# Patient Record
Sex: Female | Born: 2000
Health system: Southern US, Community
[De-identification: ages and names within clinical notes are randomized; demographics above are authoritative.]

## PROBLEM LIST (undated history)

## (undated) DIAGNOSIS — J45909 Unspecified asthma, uncomplicated: Secondary | ICD-10-CM

---
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
## 2018-10-15 ENCOUNTER — Encounter (HOSPITAL_COMMUNITY): Payer: Self-pay | Admitting: *Deleted

## 2018-10-15 ENCOUNTER — Emergency Department (HOSPITAL_COMMUNITY)
Admission: EM | Admit: 2018-10-15 | Discharge: 2018-10-15 | Disposition: A | Discharge status: Home / Self Care | Payer: Self-pay | Attending: Emergency Medicine | Admitting: Emergency Medicine

## 2018-10-15 ENCOUNTER — Other Ambulatory Visit: Payer: Self-pay

## 2018-10-15 DIAGNOSIS — J45909 Unspecified asthma, uncomplicated: Secondary | ICD-10-CM | POA: Insufficient documentation

## 2018-10-15 DIAGNOSIS — N939 Abnormal uterine and vaginal bleeding, unspecified: Secondary | ICD-10-CM | POA: Insufficient documentation

## 2018-10-15 HISTORY — DX: Unspecified asthma, uncomplicated: J45.909

## 2018-10-15 LAB — CBC WITH DIFFERENTIAL/PLATELET
Abs Immature Granulocytes: 0.01 10*3/uL (ref 0.00–0.07)
Basophils Absolute: 0 10*3/uL (ref 0.0–0.1)
Basophils Relative: 1 %
Eosinophils Absolute: 0.6 10*3/uL — ABNORMAL HIGH (ref 0.0–0.5)
Eosinophils Relative: 10 %
HCT: 39.5 % (ref 36.0–46.0)
Hemoglobin: 12.8 g/dL (ref 12.0–15.0)
Immature Granulocytes: 0 %
Lymphocytes Relative: 31 %
Lymphs Abs: 1.9 10*3/uL (ref 0.7–4.0)
MCH: 29.4 pg (ref 26.0–34.0)
MCHC: 32.4 g/dL (ref 30.0–36.0)
MCV: 90.8 fL (ref 80.0–100.0)
Monocytes Absolute: 0.4 10*3/uL (ref 0.1–1.0)
Monocytes Relative: 6 %
Neutro Abs: 3.1 10*3/uL (ref 1.7–7.7)
Neutrophils Relative %: 52 %
Platelets: 256 10*3/uL (ref 150–400)
RBC: 4.35 MIL/uL (ref 3.87–5.11)
RDW: 12 % (ref 11.5–15.5)
WBC: 6 10*3/uL (ref 4.0–10.5)
nRBC: 0 % (ref 0.0–0.2)

## 2018-10-15 LAB — COMPREHENSIVE METABOLIC PANEL
ALT: 10 U/L (ref 0–44)
AST: 16 U/L (ref 15–41)
Albumin: 5 g/dL (ref 3.5–5.0)
Alkaline Phosphatase: 53 U/L (ref 38–126)
Anion gap: 11 (ref 5–15)
BUN: 16 mg/dL (ref 6–20)
CO2: 24 mmol/L (ref 22–32)
Calcium: 9.5 mg/dL (ref 8.9–10.3)
Chloride: 106 mmol/L (ref 98–111)
Creatinine, Ser: 0.65 mg/dL (ref 0.44–1.00)
GFR calc Af Amer: >60 mL/min (ref >60)
GFR calc non Af Amer: >60 mL/min (ref >60)
Glucose, Bld: 90 mg/dL (ref 70–99)
Potassium: 3.8 mmol/L (ref 3.5–5.1)
Sodium: 141 mmol/L (ref 135–145)
Total Bilirubin: 0.7 mg/dL (ref 0.3–1.2)
Total Protein: 8.2 g/dL — ABNORMAL HIGH (ref 6.5–8.1)

## 2018-10-15 LAB — I-STAT BETA HCG BLOOD, ED (MC, WL, AP ONLY): I-stat hCG, quantitative: <5 m[IU]/mL (ref <5)

## 2018-10-15 LAB — WET PREP, GENITAL
Clue Cells Wet Prep HPF POC: NONE SEEN
Sperm: NONE SEEN
Trich, Wet Prep: NONE SEEN
Yeast Wet Prep HPF POC: NONE SEEN

## 2018-10-15 LAB — LIPASE, BLOOD: Lipase: 45 U/L (ref 11–51)

## 2018-10-15 MED ORDER — NAPROXEN 500 MG PO TABS: 500.0000 mg | ORAL_TABLET | Freq: Two times a day (BID) | ORAL | 0 refills | Status: AC

## 2018-10-15 NOTE — ED Provider Notes (Signed)
[facility]-EMERGENCY DEPT Provider Note   CSN: [id number] Arrival date & time: 10/15/18  1335    History   Chief Complaint Chief Complaint  Patient presents with  . Vaginal Bleeding    HPI Gustavo Lahnna Lingelbach is a 18 y.o. female.     The history is provided by the patient.  Vaginal Bleeding Quality:  Lighter than menses Severity:  Mild Onset quality:  Gradual Timing:  Constant Progression:  Improving Chronicity:  Recurrent Menstrual history:  Irregular Context: spontaneously   Relieved by:  Nothing Worsened by:  Nothing Associated symptoms: no abdominal pain, no back pain, no dysuria and no fever   Risk factors: no STD     Past Medical History:  Diagnosis Date  . Asthma     There are no active problems to display for this patient.   History reviewed. No pertinent surgical history.   OB History   No obstetric history on file.      Home Medications    Prior to Admission medications   Medication Sig Start Date End Date Taking? Authorizing Provider  naproxen (NAPROSYN) 500 MG tablet Take 1 tablet (500 mg total) by mouth 2 (two) times daily with a meal. 10/15/18 11/14/18  Virgina Norfolkuratolo, Latash Nouri, DO    Family History No family history on file.  Social History Social History   Tobacco Use  . Smoking status: Never Smoker  . Smokeless tobacco: Never Used  Substance Use Topics  . Alcohol use: Never    Frequency: Never  . Drug use: Never     Allergies   Patient has no known allergies.   Review of Systems Review of Systems  Constitutional: Negative for chills and fever.  HENT: Negative for ear pain and sore throat.   Eyes: Negative for pain and visual disturbance.  Respiratory: Negative for cough and shortness of breath.   Cardiovascular: Negative for chest pain and palpitations.  Gastrointestinal: Negative for abdominal pain and vomiting.  Genitourinary: Positive for vaginal bleeding. Negative for dysuria and hematuria.  Musculoskeletal:  Negative for arthralgias and back pain.  Skin: Negative for color change and rash.  Neurological: Negative for seizures and syncope.  All other systems reviewed and are negative.    Physical Exam Updated Vital Signs  ED Triage Vitals  Enc Vitals Group     BP 10/15/18 1345 104/71     Pulse Rate 10/15/18 1345 (!) 110     Resp 10/15/18 1345 16     Temp 10/15/18 1345 98.9 F (37.2 C)     Temp Source 10/15/18 1345 Oral     SpO2 10/15/18 1345 98 %     Weight --      Height --      Head Circumference --      Peak Flow --      Pain Score 10/15/18 1347 0     Pain Loc --      Pain Edu? --      Excl. in GC? --     Physical Exam Vitals signs and nursing note reviewed.  Constitutional:      General: She is not in acute distress.    Appearance: She is well-developed.  HENT:     Head: Normocephalic and atraumatic.     Nose: Nose normal.     Mouth/Throat:     Mouth: Mucous membranes are moist.  Eyes:     Extraocular Movements: Extraocular movements intact.     Conjunctiva/sclera: Conjunctivae normal.     Pupils:  Pupils are equal, round, and reactive to light.  Neck:     Musculoskeletal: Neck supple.  Cardiovascular:     Rate and Rhythm: Normal rate and regular rhythm.     Heart sounds: No murmur.  Pulmonary:     Effort: Pulmonary effort is normal. No respiratory distress.     Breath sounds: Normal breath sounds.  Abdominal:     General: There is no distension.     Palpations: Abdomen is soft.     Tenderness: There is no abdominal tenderness.  Genitourinary:    Vagina: No vaginal discharge.     Comments: Scant blood in vaginal vault, no cervical motion tenderness, no adnexal tenderness Skin:    General: Skin is warm and dry.  Neurological:     Mental Status: She is alert.      ED Treatments / Results  Labs (all labs ordered are listed, but only abnormal results are displayed) Labs Reviewed  WET PREP, GENITAL - Abnormal; Notable for the following components:       Result Value   WBC, Wet Prep HPF POC MODERATE (*)    All other components within normal limits  CBC WITH DIFFERENTIAL/PLATELET - Abnormal; Notable for the following components:   Eosinophils Absolute 0.6 (*)    All other components within normal limits  COMPREHENSIVE METABOLIC PANEL - Abnormal; Notable for the following components:   Total Protein 8.2 (*)    All other components within normal limits  LIPASE, BLOOD  I-STAT BETA HCG BLOOD, ED ([facility], [facility], [facility] ONLY)  GC/CHLAMYDIA PROBE AMP ([place]) NOT AT [facility]    EKG None  Radiology No results found.  Procedures Procedures (including critical care time)  Medications Ordered in ED Medications - No data to display   Initial Impression / Assessment and Plan / ED Course  I have reviewed the triage vital signs and the nursing notes.  Pertinent labs & imaging results that were available during my care of the patient were reviewed by me and considered in my medical decision making (see chart for details).     Clarice Zulauf is a 18 year old female who presents to the ED with vaginal bleeding.  Patient normal vitals.  No fever.  Pregnancy test negative.  Scant blood in the vaginal vault.  No tenderness of cervix on exam.  No adnexal tenderness.  Patient with normal hemoglobin.  Wet prep unremarkable.  Patient not concerned about STDs.  Overall no concern for ectopic pregnancy or acute bleeding.  Patient not symptomatic from an anemia standpoint.  Gallbladder and liver enzymes within normal limits.  Given naproxen and given information to follow-up with OB/GYN.  Patient would benefit likely from being on birth control.  Discharged in ED in good condition.  Given return precautions.  This chart was dictated using voice recognition software.  Despite best efforts to proofread,  errors can occur which can change the documentation meaning.    Final Clinical Impressions(s) / ED Diagnoses   Final diagnoses:  Abnormal uterine bleeding (AUB)     ED Discharge Orders         Ordered    naproxen (NAPROSYN) 500 MG tablet  2 times daily with meals     10/15/18 1746           Janitza Revuelta, [place], DO 10/15/18 1747

## 2018-10-15 NOTE — ED Triage Notes (Signed)
States she started her period the end of last month, slowed down a little then started back heavy. 4-5 super tampons a day. Lower abd and back pain

## 2018-10-15 NOTE — ED Notes (Signed)
ED Provider at bedside. 

## 2018-10-18 LAB — GC/CHLAMYDIA PROBE AMP (~~LOC~~) NOT AT ARMC
Chlamydia: NEGATIVE
Neisseria Gonorrhea: NEGATIVE

## 2018-10-24 ENCOUNTER — Ambulatory Visit (INDEPENDENT_AMBULATORY_CARE_PROVIDER_SITE_OTHER): Payer: Medicaid Other | Admitting: Obstetrics & Gynecology

## 2018-10-24 ENCOUNTER — Encounter: Payer: Self-pay | Admitting: Obstetrics & Gynecology

## 2018-10-24 ENCOUNTER — Ambulatory Visit (HOSPITAL_BASED_OUTPATIENT_CLINIC_OR_DEPARTMENT_OTHER)
Admission: RE | Admit: 2018-10-24 | Discharge: 2018-10-24 | Disposition: A | Discharge status: Home / Self Care | Payer: Self-pay | Source: Ambulatory Visit | Attending: Obstetrics & Gynecology | Admitting: Obstetrics & Gynecology

## 2018-10-24 ENCOUNTER — Other Ambulatory Visit (HOSPITAL_COMMUNITY)
Admission: RE | Admit: 2018-10-24 | Discharge: 2018-10-24 | Disposition: A | Discharge status: Home / Self Care | Payer: Medicaid Other | Source: Ambulatory Visit | Attending: Obstetrics & Gynecology | Admitting: Obstetrics & Gynecology

## 2018-10-24 ENCOUNTER — Other Ambulatory Visit: Payer: Self-pay

## 2018-10-24 VITALS — BP 97/57 | HR 80 | Ht 60.0 in | Wt 85.0 lb

## 2018-10-24 DIAGNOSIS — Z3009 Encounter for other general counseling and advice on contraception: Secondary | ICD-10-CM | POA: Diagnosis not present

## 2018-10-24 DIAGNOSIS — N939 Abnormal uterine and vaginal bleeding, unspecified: Secondary | ICD-10-CM | POA: Diagnosis present

## 2018-10-24 DIAGNOSIS — R102 Pelvic and perineal pain: Secondary | ICD-10-CM | POA: Insufficient documentation

## 2018-10-24 DIAGNOSIS — Z113 Encounter for screening for infections with a predominantly sexual mode of transmission: Secondary | ICD-10-CM | POA: Diagnosis not present

## 2018-10-24 MED ORDER — CEFTRIAXONE SODIUM 250 MG IJ SOLR
250.0000 mg | Freq: Once | INTRAMUSCULAR | Status: AC
  Administered 2018-10-24: 250 mg via INTRAMUSCULAR

## 2018-10-24 MED ORDER — AZITHROMYCIN 500 MG PO TABS: 1000.0000 mg | ORAL_TABLET | Freq: Once | ORAL | 1 refills | Status: AC

## 2018-10-24 MED FILL — AZITHROMYCIN 500 MG TABLET: 500 | 1 days supply | Qty: 2 | Fill #0

## 2018-10-24 NOTE — Addendum Note (Signed)
Addended by: Phill Myron on: 10/24/2018 12:39 PM   Modules accepted: Orders

## 2018-10-24 NOTE — Progress Notes (Signed)
Subjective:     Gina Hall is a 18 y.o. female here for eval of pain and irreg bleeding. LMP late June Pt reports normal menses at that time. At the end of her cycle, the bleeding became heavier and has continued. She has been taking Naproxen with some relief ov the bleeding but, it has been intermittent for 22 days. Pt denies new sexual partners.   She is sexually active currently with no contraception. She was on Depo Provera for 2 injections.  Her last injection was Feb. She stopped due to irreg menses.  She reports pain with this cycle that is worse with activity. She denies abnormal discharge or odor.      Gynecologic History No LMP recorded. Contraception: none Last Pap: n/a Last mammogram: n/a  Obstetric History OB History  Gravida Para Term Preterm AB Living  1 1   1   1   SAB TAB Ectopic Multiple Live Births          1    # Outcome Date GA Lbr Len/2nd Weight Sex Delivery Anes PTL Lv  1 Preterm  [redacted]w[redacted]d   M Vag-Spont   LIV   The following portions of the patient's history were reviewed and updated as appropriate: allergies, current medications, past family history, past medical history, past social history, past surgical history and problem list.  Review of Systems Pertinent items are noted in HPI.    Objective:  Ht 5' (1.524 m)   Wt 85 lb (38.6 kg)   BMI 16.60 kg/m   CONSTITUTIONAL: Well-developed, well-nourished female in no acute distress.  HENT:  Normocephalic, atraumatic EYES: Conjunctivae and EOM are normal. No scleral icterus.  NECK: Normal range of motion SKIN: Skin is warm and dry. No rash noted. Not diaphoretic.No pallor. [place]: Alert and oriented to person, place, and time. Normal coordination.  Pelvic :  GU: EGBUS: no lesions Vagina: no blood in vault; Cervix: no lesion; min mucopurulent d/c; +CMT Uterus: small, mobile Adnexa: no masses; bilateral tenderness; right >left       Assessment:  Pelvic pain and irreg menses- suspect PID. Was seen in ED  with neg cx but, exam and history suggest otherwise. Will repeat cx but will treat presumptively and obtain an [place]  Contraception counseling- Reviewed all forms of birth control options available including abstinence; over the counter/barrier methods; hormonal contraceptive medication including pill, patch, ring, injection,contraceptive implant; hormonal and nonhormonal IUDs; permanent sterilization options including vasectomy and the various tubal sterilization modalities. Risks and benefits reviewed.  Questions were answered.  Information was given to patient to review.      Plan:   Rocephin 250mg  IM and Azithromycin 1gram today F/u cx Pelvic US F/u for Nexplanon  Total face-to-face time with patient was 20 min.  Greater than 50% was spent in counseling and coordination of care with the patient.  Ruthmary Occhipinti L. Harraway-Smith, M.D., [place]. Harraway-Smith, M.D., Cherlynn June

## 2018-10-24 NOTE — Progress Notes (Signed)
Patient is for follow up for bleeding from ED.  Patient states she was given pills (naproxen) for her bleeding/pain.Patient states her period lasted three weeks.  Kathrene Alu RN

## 2018-10-27 LAB — CERVICOVAGINAL ANCILLARY ONLY
Bacterial vaginitis: NEGATIVE
Candida vaginitis: NEGATIVE
Chlamydia: NEGATIVE
Neisseria Gonorrhea: NEGATIVE
Trichomonas: NEGATIVE

## 2018-11-17 ENCOUNTER — Other Ambulatory Visit: Payer: Self-pay

## 2018-11-17 ENCOUNTER — Encounter: Payer: Self-pay | Admitting: Obstetrics and Gynecology

## 2018-11-17 ENCOUNTER — Ambulatory Visit (INDEPENDENT_AMBULATORY_CARE_PROVIDER_SITE_OTHER): Payer: Medicaid Other | Admitting: Obstetrics and Gynecology

## 2018-11-17 VITALS — BP 97/62 | HR 92 | Wt 87.0 lb

## 2018-11-17 DIAGNOSIS — Z30017 Encounter for initial prescription of implantable subdermal contraceptive: Secondary | ICD-10-CM

## 2018-11-17 DIAGNOSIS — Z3202 Encounter for pregnancy test, result negative: Secondary | ICD-10-CM

## 2018-11-17 DIAGNOSIS — Z01812 Encounter for preprocedural laboratory examination: Secondary | ICD-10-CM

## 2018-11-17 LAB — POCT URINE PREGNANCY: Preg Test, Ur: NEGATIVE

## 2018-11-17 MED ORDER — ETONOGESTREL 68 MG ~~LOC~~ IMPL
68.0000 mg | DRUG_IMPLANT | Freq: Once | SUBCUTANEOUS | Status: AC
  Administered 2018-11-17: 68 mg via SUBCUTANEOUS

## 2018-11-17 NOTE — Addendum Note (Signed)
Addended by: Phill Myron on: 11/17/2018 11:12 AM   Modules accepted: Orders

## 2018-11-17 NOTE — Progress Notes (Signed)
18 yo P0101 with LMP in late July presenting today for contraception counseling. Patient was recently treated for PID on 10/2018 and denies any pelvic pain or abnormal discahrge. She reports a montly period lasting 7-10 days. She is sexually active using condoms for contraception. Patient is without any complaints  Past Medical History:  Diagnosis Date  . Asthma    History reviewed. No pertinent surgical history. Family History  Problem Relation Age of Onset  . Cancer Neg Hx   . Diabetes Neg Hx   . Hypertension Neg Hx    Social History   Tobacco Use  . Smoking status: Never Smoker  . Smokeless tobacco: Never Used  Substance Use Topics  . Alcohol use: Never    Frequency: Never  . Drug use: Never   ROS  See pertinent in HPI  Blood pressure 97/62, pulse 92, weight 87 lb (39.5 kg).  GENERAL: Well-developed, well-nourished female in no acute distress.  ABDOMEN: Soft, nontender, nondistended. No organomegaly. NEURO: Alert and oriented x 3 EXTREMITIES: No cyanosis, clubbing, or edema, 2+ distal pulses.  A/P 18 yo P0101 here for contraception counseling - Contraception options reviewed. Patient opted for nexplanon   Patient given informed consent, signed copy in the chart, time out was performed. Pregnancy test was negative. Appropriate time out taken.  Patient's left arm was prepped and draped in the usual sterile fashion.. The ruler used to measure and mark insertion area.  Patient was prepped with alcohol swab and then injected with 2 cc of 1% lidocaine with epinephrine.  Patient was prepped with betadine, Nexplanon removed form packaging.  Device confirmed in needle, then inserted full length of needle and withdrawn per handbook instructions.  Patient insertion site covered with steri-strips and [place].   Minimal blood loss.  Patient tolerated the procedure well.   - Patient due for annual exam with pap smear on or after 06/26/2019

## 2018-11-17 NOTE — Patient Instructions (Signed)

## 2019-06-28 ENCOUNTER — Encounter: Payer: Self-pay | Admitting: Obstetrics & Gynecology

## 2019-06-28 ENCOUNTER — Other Ambulatory Visit: Payer: Self-pay

## 2019-06-28 ENCOUNTER — Ambulatory Visit (INDEPENDENT_AMBULATORY_CARE_PROVIDER_SITE_OTHER): Payer: Medicaid Other | Admitting: Obstetrics & Gynecology

## 2019-06-28 ENCOUNTER — Other Ambulatory Visit (HOSPITAL_COMMUNITY)
Admission: RE | Admit: 2019-06-28 | Discharge: 2019-06-28 | Disposition: A | Discharge status: Home / Self Care | Payer: Medicaid Other | Source: Ambulatory Visit | Attending: Obstetrics & Gynecology | Admitting: Obstetrics & Gynecology

## 2019-06-28 VITALS — BP 107/69 | HR 100 | Ht 60.0 in | Wt 86.0 lb

## 2019-06-28 DIAGNOSIS — Z01419 Encounter for gynecological examination (general) (routine) without abnormal findings: Secondary | ICD-10-CM

## 2019-06-28 DIAGNOSIS — Z975 Presence of (intrauterine) contraceptive device: Secondary | ICD-10-CM

## 2019-06-28 DIAGNOSIS — N921 Excessive and frequent menstruation with irregular cycle: Secondary | ICD-10-CM

## 2019-06-28 MED ORDER — NORETHIN ACE-ETH ESTRAD-FE 1-20 MG-MCG(24) PO TABS: 1.0000 | ORAL_TABLET | Freq: Every day | ORAL | 11 refills | Status: AC

## 2019-06-28 NOTE — Progress Notes (Signed)
Patient states she has had a lot of bleeding with the Nexplanon. Patient started bleeding on Feb. 15th and has had bleeding everyday. Interested in having Nexplanon  removed. Armandina Stammer RN

## 2019-06-28 NOTE — Patient Instructions (Signed)
Levonorgestrel intrauterine device (IUD) What is this medicine? LEVONORGESTREL IUD (LEE voe nor jes trel) is a contraceptive (birth control) device. The device is placed inside the uterus by a healthcare professional. It is used to prevent pregnancy. This device can also be used to treat heavy bleeding that occurs during your period. This medicine may be used for other purposes; ask your health care provider or pharmacist if you have questions. COMMON BRAND NAME(S): Kyleena, LILETTA, Mirena, Skyla What should I tell my health care provider before I take this medicine? They need to know if you have any of these conditions:  abnormal Pap smear  cancer of the breast, uterus, or cervix  diabetes  endometritis  genital or pelvic infection now or in the past  have more than one sexual partner or your partner has more than one partner  heart disease  history of an ectopic or tubal pregnancy  immune system problems  IUD in place  liver disease or tumor  problems with blood clots or take blood-thinners  seizures  use intravenous drugs  uterus of unusual shape  vaginal bleeding that has not been explained  an unusual or allergic reaction to levonorgestrel, other hormones, silicone, or polyethylene, medicines, foods, dyes, or preservatives  pregnant or trying to get pregnant  breast-feeding How should I use this medicine? This device is placed inside the uterus by a health care professional. Talk to your pediatrician regarding the use of this medicine in children. Special care may be needed. Overdosage: If you think you have taken too much of this medicine contact a poison control center or emergency room at once. NOTE: This medicine is only for you. Do not share this medicine with others. What if I miss a dose? This does not apply. Depending on the brand of device you have inserted, the device will need to be replaced every 3 to 6 years if you wish to continue using this type  of birth control. What may interact with this medicine? Do not take this medicine with any of the following medications:  amprenavir  bosentan  fosamprenavir This medicine may also interact with the following medications:  aprepitant  armodafinil  barbiturate medicines for inducing sleep or treating seizures  bexarotene  boceprevir  griseofulvin  medicines to treat seizures like carbamazepine, ethotoin, felbamate, oxcarbazepine, phenytoin, topiramate  modafinil  pioglitazone  rifabutin  rifampin  rifapentine  some medicines to treat HIV infection like atazanavir, efavirenz, indinavir, lopinavir, nelfinavir, tipranavir, ritonavir  St. John's wort  warfarin This list may not describe all possible interactions. Give your health care provider a list of all the medicines, herbs, non-prescription drugs, or dietary supplements you use. Also tell them if you smoke, drink alcohol, or use illegal drugs. Some items may interact with your medicine. What should I watch for while using this medicine? Visit your doctor or health care professional for regular check ups. See your doctor if you or your partner has sexual contact with others, becomes HIV positive, or gets a sexual transmitted disease. This product does not protect you against HIV infection (AIDS) or other sexually transmitted diseases. You can check the placement of the IUD yourself by reaching up to the top of your vagina with clean fingers to feel the threads. Do not pull on the threads. It is a good habit to check placement after each menstrual period. Call your doctor right away if you feel more of the IUD than just the threads or if you cannot feel the threads at   all. The IUD may come out by itself. You may become pregnant if the device comes out. If you notice that the IUD has come out use a backup birth control method like condoms and call your health care provider. Using tampons will not change the position of the  IUD and are okay to use during your period. This IUD can be safely scanned with magnetic resonance imaging (MRI) only under specific conditions. Before you have an MRI, tell your healthcare provider that you have an IUD in place, and which type of IUD you have in place. What side effects may I notice from receiving this medicine? Side effects that you should report to your doctor or health care professional as soon as possible:  allergic reactions like skin rash, itching or hives, swelling of the face, lips, or tongue  fever, flu-like symptoms  genital sores  high blood pressure  no menstrual period for 6 weeks during use  pain, swelling, warmth in the leg  pelvic pain or tenderness  severe or sudden headache  signs of pregnancy  stomach cramping  sudden shortness of breath  trouble with balance, talking, or walking  unusual vaginal bleeding, discharge  yellowing of the eyes or skin Side effects that usually do not require medical attention (report to your doctor or health care professional if they continue or are bothersome):  acne  breast pain  change in sex drive or performance  changes in weight  cramping, dizziness, or faintness while the device is being inserted  headache  irregular menstrual bleeding within first 3 to 6 months of use  nausea This list may not describe all possible side effects. Call your doctor for medical advice about side effects. You may report side effects to FDA at 1-800-FDA-1088. Where should I keep my medicine? This does not apply. NOTE: This sheet is a summary. It may not cover all possible information. If you have questions about this medicine, talk to your doctor, pharmacist, or health care provider.  2020 Elsevier/Gold Standard (2018-02-01 13:22:01)  

## 2019-06-28 NOTE — Progress Notes (Signed)
Subjective:     Gina Hall is a 19 y.o. female here for a routine exam.  Current complaints: Pt reports that she has had bleeding everyday except for 2 weeks since she's had the Nexplanon.  Pt reports that she is planning on going to Med school.     Gynecologic History Patient's last menstrual period was 05/22/2019 (exact date). Contraception: Nexplanon Last Pap: n/a.    Obstetric History OB History  Gravida Para Term Preterm AB Living  1 1   1   1   SAB TAB Ectopic Multiple Live Births          1    # Outcome Date GA Lbr Len/2nd Weight Sex Delivery Anes PTL Lv  1 Preterm 10/30/15 [redacted]w[redacted]d   M Vag-Spont   LIV     The following portions of the patient's history were reviewed and updated as appropriate: allergies, current medications, past family history, past medical history, past social history, past surgical history and problem list.  Review of Systems Pertinent items are noted in HPI.    Objective:  BP 107/69   Pulse 100   Ht 5' (1.524 m)   Wt 86 lb (39 kg)   LMP 05/22/2019 (Exact Date)   BMI 16.80 kg/m \  General Appearance:    Alert, cooperative, no distress, appears stated age  Head:    Normocephalic, without obvious abnormality, atraumatic  Eyes:    conjunctiva/corneas clear, EOM's intact, both eyes  Ears:    Normal external ear canals, both ears  Nose:   Nares normal, septum midline, mucosa normal, no drainage    or sinus tenderness  Throat:   Lips, mucosa, and tongue normal; teeth and gums normal  Neck:   Supple, symmetrical, trachea midline, no adenopathy;    thyroid:  no enlargement/tenderness/nodules  Back:     Symmetric, no curvature, ROM normal, no CVA tenderness  Lungs:     respirations unlabored  Chest Wall:    No tenderness or deformity   Heart:    Regular rate and rhythm  Breast Exam:    No tenderness, masses, or nipple abnormality  Abdomen:     Soft, non-tender, bowel sounds active all four quadrants,    no masses, no organomegaly  Genitalia:    Normal  female without lesion, discharge or tenderness     Extremities:   Extremities normal, atraumatic, no cyanosis or edema; Nexplanon felt in left arm  Pulses:   2+ and symmetric all extremities  Skin:   Skin color, texture, turgor normal, no rashes or lesions     Assessment:    Healthy female exam.   Breakthrough bleeding on Nexplanon. Pt is ok with keeping the Nexplanon if we can get th bleeding stopped.    Plan:   F/u PAP and cx Loestrin 1/20 1 po q day F/u 7 weeks  Iran Rowe L. Harraway-Smith, M.D., 2/20

## 2019-06-29 ENCOUNTER — Encounter: Payer: Self-pay | Admitting: Obstetrics & Gynecology

## 2019-07-03 LAB — CYTOLOGY - PAP
Chlamydia: NEGATIVE
Comment: NEGATIVE
Comment: NORMAL
Diagnosis: NEGATIVE
Diagnosis: REACTIVE
Neisseria Gonorrhea: NEGATIVE

## 2019-08-16 ENCOUNTER — Other Ambulatory Visit: Payer: Self-pay

## 2019-08-16 ENCOUNTER — Ambulatory Visit (INDEPENDENT_AMBULATORY_CARE_PROVIDER_SITE_OTHER): Payer: Medicaid Other | Admitting: Obstetrics & Gynecology

## 2019-08-16 ENCOUNTER — Encounter: Payer: Self-pay | Admitting: Obstetrics & Gynecology

## 2019-08-16 VITALS — BP 101/67 | HR 94 | Ht 60.0 in | Wt 87.0 lb

## 2019-08-16 DIAGNOSIS — Z3046 Encounter for surveillance of implantable subdermal contraceptive: Secondary | ICD-10-CM

## 2019-08-16 DIAGNOSIS — Z30430 Encounter for insertion of intrauterine contraceptive device: Secondary | ICD-10-CM

## 2019-08-16 DIAGNOSIS — Z3009 Encounter for other general counseling and advice on contraception: Secondary | ICD-10-CM

## 2019-08-16 MED ORDER — LEVONORGESTREL 19.5 MCG/DAY IU IUD
INTRAUTERINE_SYSTEM | Freq: Once | INTRAUTERINE | Status: AC
  Administered 2019-08-16: 1 via INTRAUTERINE

## 2019-08-16 NOTE — Addendum Note (Signed)
Addended by: Mikey Bussing on: 08/16/2019 02:39 PM   Modules accepted: Orders

## 2019-08-16 NOTE — Patient Instructions (Signed)
Intrauterine Device Insertion, Care After  This sheet gives you information about how to care for yourself after your procedure. Your health care provider may also give you more specific instructions. If you have problems or questions, contact your health care provider. What can I expect after the procedure? After the procedure, it is common to have:  Cramps and pain in the abdomen.  Light bleeding (spotting) or heavier bleeding that is like your menstrual period. This may last for up to a few days.  Lower back pain.  Dizziness.  Headaches.  Nausea. Follow these instructions at home:  Before resuming sexual activity, check to make sure that you can feel the IUD string(s). You should be able to feel the end of the string(s) below the opening of your cervix. If your IUD string is in place, you may resume sexual activity. ? If you had a hormonal IUD inserted more than 7 days after your most recent period started, you will need to use a backup method of birth control for 7 days after IUD insertion. Ask your health care provider whether this applies to you.  Continue to check that the IUD is still in place by feeling for the string(s) after every menstrual period, or once a month.  Take over-the-counter and prescription medicines only as told by your health care provider.  Do not drive or use heavy machinery while taking prescription pain medicine.  Keep all follow-up visits as told by your health care provider. This is important. Contact a health care provider if:  You have bleeding that is heavier or lasts longer than a normal menstrual cycle.  You have a fever.  You have cramps or abdominal pain that get worse or do not get better with medicine.  You develop abdominal pain that is new or is not in the same area of earlier cramping and pain.  You feel lightheaded or weak.  You have abnormal or bad-smelling discharge from your vagina.  You have pain during sexual  activity.  You have any of the following problems with your IUD string(s): ? The string bothers or hurts you or your sexual partner. ? You cannot feel the string. ? The string has gotten longer.  You can feel the IUD in your vagina.  You think you may be pregnant, or you miss your menstrual period.  You think you may have an STI (sexually transmitted infection). Get help right away if:  You have flu-like symptoms.  You have a fever and chills.  You can feel that your IUD has slipped out of place. Summary  After the procedure, it is common to have cramps and pain in the abdomen. It is also common to have light bleeding (spotting) or heavier bleeding that is like your menstrual period.  Continue to check that the IUD is still in place by feeling for the string(s) after every menstrual period, or once a month.  Keep all follow-up visits as told by your health care provider. This is important.  Contact your health care provider if you have problems with your IUD string(s), such as the string getting longer or bothering you or your sexual partner. This information is not intended to replace advice given to you by your health care provider. Make sure you discuss any questions you have with your health care provider. Document Revised: 03/05/2017 Document Reviewed: 02/12/2016 Elsevier Patient Education  2020 Else[employer]. Levonorgestrel intrauterine device (IUD) What is this medicine? LEVONORGESTREL IUD (LEE voe nor jes trel) is a   contraceptive (birth control) device. The device is placed inside the uterus by a healthcare professional. It is used to prevent pregnancy. This device can also be used to treat heavy bleeding that occurs during your period. This medicine may be used for other purposes; ask your health care provider or pharmacist if you have questions. COMMON BRAND NAME(S): Kyleena, LILETTA, Mirena, Skyla What should I tell my health care provider before I take this  medicine? They need to know if you have any of these conditions:  abnormal Pap smear  cancer of the breast, uterus, or cervix  diabetes  endometritis  genital or pelvic infection now or in the past  have more than one sexual partner or your partner has more than one partner  heart disease  history of an ectopic or tubal pregnancy  immune system problems  IUD in place  liver disease or tumor  problems with blood clots or take blood-thinners  seizures  use intravenous drugs  uterus of unusual shape  vaginal bleeding that has not been explained  an unusual or allergic reaction to levonorgestrel, other hormones, silicone, or polyethylene, medicines, foods, dyes, or preservatives  pregnant or trying to get pregnant  breast-feeding How should I use this medicine? This device is placed inside the uterus by a health care professional. Talk to your pediatrician regarding the use of this medicine in children. Special care may be needed. Overdosage: If you think you have taken too much of this medicine contact a poison control center or emergency room at once. NOTE: This medicine is only for you. Do not share this medicine with others. What if I miss a dose? This does not apply. Depending on the brand of device you have inserted, the device will need to be replaced every 3 to 6 years if you wish to continue using this type of birth control. What may interact with this medicine? Do not take this medicine with any of the following medications:  amprenavir  bosentan  fosamprenavir This medicine may also interact with the following medications:  aprepitant  armodafinil  barbiturate medicines for inducing sleep or treating seizures  bexarotene  boceprevir  griseofulvin  medicines to treat seizures like carbamazepine, ethotoin, felbamate, oxcarbazepine, phenytoin, topiramate  modafinil  pioglitazone  rifabutin  rifampin  rifapentine  some medicines to  treat HIV infection like atazanavir, efavirenz, indinavir, lopinavir, nelfinavir, tipranavir, ritonavir  St. John's wort  warfarin This list may not describe all possible interactions. Give your health care provider a list of all the medicines, herbs, non-prescription drugs, or dietary supplements you use. Also tell them if you smoke, drink alcohol, or use illegal drugs. Some items may interact with your medicine. What should I watch for while using this medicine? Visit your doctor or health care professional for regular check ups. See your doctor if you or your partner has sexual contact with others, becomes HIV positive, or gets a sexual transmitted disease. This product does not protect you against HIV infection (AIDS) or other sexually transmitted diseases. You can check the placement of the IUD yourself by reaching up to the top of your vagina with clean fingers to feel the threads. Do not pull on the threads. It is a good habit to check placement after each menstrual period. Call your doctor right away if you feel more of the IUD than just the threads or if you cannot feel the threads at all. The IUD may come out by itself. You may become pregnant if the device comes   out. If you notice that the IUD has come out use a backup birth control method like condoms and call your health care provider. Using tampons will not change the position of the IUD and are okay to use during your period. This IUD can be safely scanned with magnetic resonance imaging (MRI) only under specific conditions. Before you have an MRI, tell your healthcare provider that you have an IUD in place, and which type of IUD you have in place. What side effects may I notice from receiving this medicine? Side effects that you should report to your doctor or health care professional as soon as possible:  allergic reactions like skin rash, itching or hives, swelling of the face, lips, or tongue  fever, flu-like symptoms  genital  sores  high blood pressure  no menstrual period for 6 weeks during use  pain, swelling, warmth in the leg  pelvic pain or tenderness  severe or sudden headache  signs of pregnancy  stomach cramping  sudden shortness of breath  trouble with balance, talking, or walking  unusual vaginal bleeding, discharge  yellowing of the eyes or skin Side effects that usually do not require medical attention (report to your doctor or health care professional if they continue or are bothersome):  acne  breast pain  change in sex drive or performance  changes in weight  cramping, dizziness, or faintness while the device is being inserted  headache  irregular menstrual bleeding within first 3 to 6 months of use  nausea This list may not describe all possible side effects. Call your doctor for medical advice about side effects. You may report side effects to FDA at 1-800-FDA-1088. Where should I keep my medicine? This does not apply. NOTE: This sheet is a summary. It may not cover all possible information. If you have questions about this medicine, talk to your doctor, pharmacist, or health care provider.  2020 Elsevier/Gold Standard (2018-02-01 13:22:01)  

## 2019-08-16 NOTE — Progress Notes (Addendum)
GYNECOLOGY CLINIC PROCEDURE NOTE  Gina Hall is a 19 y.o. G1P0101 here for Liletta IUD insertion. She has the Nexplanon but, has had issues with bleeding. She tired OCPs and it did stop the bleeding but, only while she was on the pills. She wants the Nexplanon removed and an IUD inserted. Last pap smear was on 06/25/2019 and was normal.  IUD Insertion Procedure Note Patient identified, informed consent performed.  Discussed risks of irregular bleeding, cramping, infection, malpositioning or misplacement of the IUD outside the uterus which may require further procedures. Time out was performed.  Urine pregnancy test negative.  Patient's left arm was prepped and draped in the usual sterile fashion. Patient was prepped with alcohol swab and then injected with 3 ml of 1 % lidocaine.  She was prepped with betadine.  A #10 blade was used to make a small incision over the Nexplanon rod.  Using curved forceps, the end of the rod was grasped and the scar tissue was removed and the device was removed intact.  There was minimal blood loss. The incision site was covered with guaze and a pressure bandage to reduce any bruising.  The patient tolerated the procedure well and was given post procedure instructions.  At this point a speculum placed in the vagina.  Cervix visualized.  Cleaned with Betadine x 2.  Grasped anteriorly with a single tooth tenaculum.  Uterus sounded to 8cm.  Liletta IUD placed per manufacturer's recommendations.  Strings trimmed to 3 cm. Tenaculum was removed, good hemostasis noted.  Patient tolerated procedure well. She did experience cramping as was given Motrin in the ofc.     Patient was given post-procedure instructions.  Patient was asked to follow up in 4 weeks for IUD check.  The patient tolerated the procedure well and was given post procedure instructions.  Gina Hall, M.D., FACOG  Addendum:  Pt had significant pain post procedure. A TVUS was performed to confirm  placement of the IUD. It was in the endometrial cavity.   Gina Hall L. Hall, M.D., [facility]

## 2019-09-13 ENCOUNTER — Ambulatory Visit (INDEPENDENT_AMBULATORY_CARE_PROVIDER_SITE_OTHER): Payer: Medicaid Other | Admitting: Obstetrics & Gynecology

## 2019-09-13 ENCOUNTER — Other Ambulatory Visit: Payer: Self-pay

## 2019-09-13 ENCOUNTER — Encounter: Payer: Self-pay | Admitting: Obstetrics & Gynecology

## 2019-09-13 VITALS — BP 90/58 | Ht 60.0 in | Wt 89.0 lb

## 2019-09-13 DIAGNOSIS — Z30431 Encounter for routine checking of intrauterine contraceptive device: Secondary | ICD-10-CM

## 2019-09-13 NOTE — Progress Notes (Signed)
Patient presents for string check. Derec Mozingo, RN  

## 2019-09-13 NOTE — Patient Instructions (Signed)
Levonorgestrel intrauterine device (IUD) What is this medicine? LEVONORGESTREL IUD (LEE voe nor jes trel) is a contraceptive (birth control) device. The device is placed inside the uterus by a healthcare professional. It is used to prevent pregnancy. This device can also be used to treat heavy bleeding that occurs during your period. This medicine may be used for other purposes; ask your health care provider or pharmacist if you have questions. COMMON BRAND NAME(S): Kyleena, LILETTA, Mirena, Skyla What should I tell my health care provider before I take this medicine? They need to know if you have any of these conditions:  abnormal Pap smear  cancer of the breast, uterus, or cervix  diabetes  endometritis  genital or pelvic infection now or in the past  have more than one sexual partner or your partner has more than one partner  heart disease  history of an ectopic or tubal pregnancy  immune system problems  IUD in place  liver disease or tumor  problems with blood clots or take blood-thinners  seizures  use intravenous drugs  uterus of unusual shape  vaginal bleeding that has not been explained  an unusual or allergic reaction to levonorgestrel, other hormones, silicone, or polyethylene, medicines, foods, dyes, or preservatives  pregnant or trying to get pregnant  breast-feeding How should I use this medicine? This device is placed inside the uterus by a health care professional. Talk to your pediatrician regarding the use of this medicine in children. Special care may be needed. Overdosage: If you think you have taken too much of this medicine contact a poison control center or emergency room at once. NOTE: This medicine is only for you. Do not share this medicine with others. What if I miss a dose? This does not apply. Depending on the brand of device you have inserted, the device will need to be replaced every 3 to 6 years if you wish to continue using this type  of birth control. What may interact with this medicine? Do not take this medicine with any of the following medications:  amprenavir  bosentan  fosamprenavir This medicine may also interact with the following medications:  aprepitant  armodafinil  barbiturate medicines for inducing sleep or treating seizures  bexarotene  boceprevir  griseofulvin  medicines to treat seizures like carbamazepine, ethotoin, felbamate, oxcarbazepine, phenytoin, topiramate  modafinil  pioglitazone  rifabutin  rifampin  rifapentine  some medicines to treat HIV infection like atazanavir, efavirenz, indinavir, lopinavir, nelfinavir, tipranavir, ritonavir  St. John's wort  warfarin This list may not describe all possible interactions. Give your health care provider a list of all the medicines, herbs, non-prescription drugs, or dietary supplements you use. Also tell them if you smoke, drink alcohol, or use illegal drugs. Some items may interact with your medicine. What should I watch for while using this medicine? Visit your doctor or health care professional for regular check ups. See your doctor if you or your partner has sexual contact with others, becomes HIV positive, or gets a sexual transmitted disease. This product does not protect you against HIV infection (AIDS) or other sexually transmitted diseases. You can check the placement of the IUD yourself by reaching up to the top of your vagina with clean fingers to feel the threads. Do not pull on the threads. It is a good habit to check placement after each menstrual period. Call your doctor right away if you feel more of the IUD than just the threads or if you cannot feel the threads at   all. The IUD may come out by itself. You may become pregnant if the device comes out. If you notice that the IUD has come out use a backup birth control method like condoms and call your health care provider. Using tampons will not change the position of the  IUD and are okay to use during your period. This IUD can be safely scanned with magnetic resonance imaging (MRI) only under specific conditions. Before you have an MRI, tell your healthcare provider that you have an IUD in place, and which type of IUD you have in place. What side effects may I notice from receiving this medicine? Side effects that you should report to your doctor or health care professional as soon as possible:  allergic reactions like skin rash, itching or hives, swelling of the face, lips, or tongue  fever, flu-like symptoms  genital sores  high blood pressure  no menstrual period for 6 weeks during use  pain, swelling, warmth in the leg  pelvic pain or tenderness  severe or sudden headache  signs of pregnancy  stomach cramping  sudden shortness of breath  trouble with balance, talking, or walking  unusual vaginal bleeding, discharge  yellowing of the eyes or skin Side effects that usually do not require medical attention (report to your doctor or health care professional if they continue or are bothersome):  acne  breast pain  change in sex drive or performance  changes in weight  cramping, dizziness, or faintness while the device is being inserted  headache  irregular menstrual bleeding within first 3 to 6 months of use  nausea This list may not describe all possible side effects. Call your doctor for medical advice about side effects. You may report side effects to FDA at 1-800-FDA-1088. Where should I keep my medicine? This does not apply. NOTE: This sheet is a summary. It may not cover all possible information. If you have questions about this medicine, talk to your doctor, pharmacist, or health care provider.  2020 Elsevier/Gold Standard (2018-02-01 13:22:01)  

## 2019-09-13 NOTE — Progress Notes (Signed)
  GYNECOLOGY OFFICE ENCOUNTER NOTE  History:  19 y.o. G1P0101 here today for today for IUD string check; Liletta  IUD was placed  08/16/2019. No complaints about the IUD, no concerning side effects.  The following portions of the patient's history were reviewed and updated as appropriate: allergies, current medications, past family history, past medical history, past social history, past surgical history and problem list.   Review of Systems:  Pertinent items are noted in HPI.   Objective:  Physical Exam Blood pressure (!) 90/58, height 5' (1.524 m), weight 89 lb (40.4 kg). CONSTITUTIONAL: Well-developed, well-nourished female in no acute distress.  HENT:  Normocephalic, atraumatic. External right and left ear normal. Oropharynx is clear and moist EYES: Conjunctivae and EOM are normal. Pupils are equal, round, and reactive to light. No scleral icterus.  NECK: Normal range of motion, supple, no masses CARDIOVASCULAR: Normal heart rate noted RESPIRATORY: Effort and breath sounds normal, no problems with respiration noted ABDOMEN: Soft, no distention noted.   PELVIC: Normal appearing external genitalia; normal appearing vaginal mucosa and cervix.  IUD strings visualized, about 3 cm in length outside cervix.   Assessment & Plan:  Patient to keep IUD in place for up to seven years; can come in for removal if she desires pregnancy earlier or for any concerning side effects.  Pt will go to [facility] to establish primary care.   Effie Janoski L. Harraway-Smith, MD, FACOG Obstetrician & Gynecologist, [facility] [employer], [facility]

## 2019-09-25 IMAGING — US TRANSVAGINAL ULTRASOUND OF PELVIS
1 series · 14 of 25 positions shown · non-contrast
Comparison: None.

CLINICAL DATA: Abnormal uterine bleeding, pelvic pain

EXAM:
ULTRASOUND PELVIS TRANSVAGINAL
TECHNIQUE: Transvaginal ultrasound examination of the pelvis was performed
including evaluation of the uterus, ovaries, adnexal regions, and
pelvic cul-de-sac.

[Series 1: transvaginal ultrasound of pelvis · 14 of 33 slices shown]
[im 1/33]
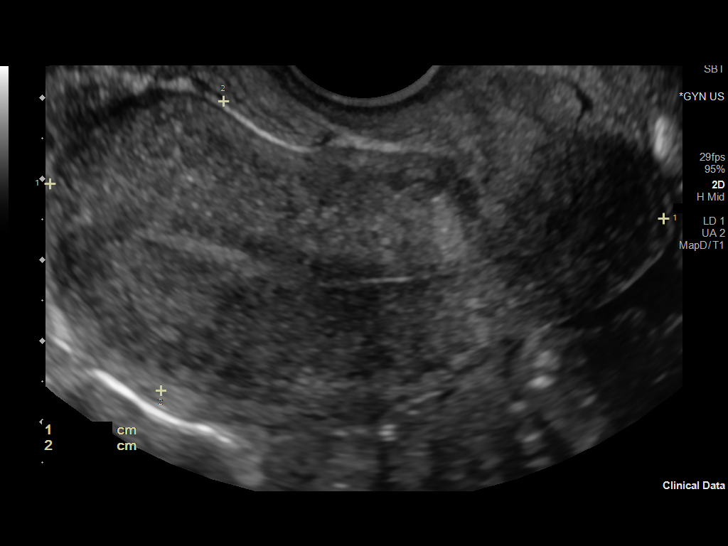
[im 3/33]
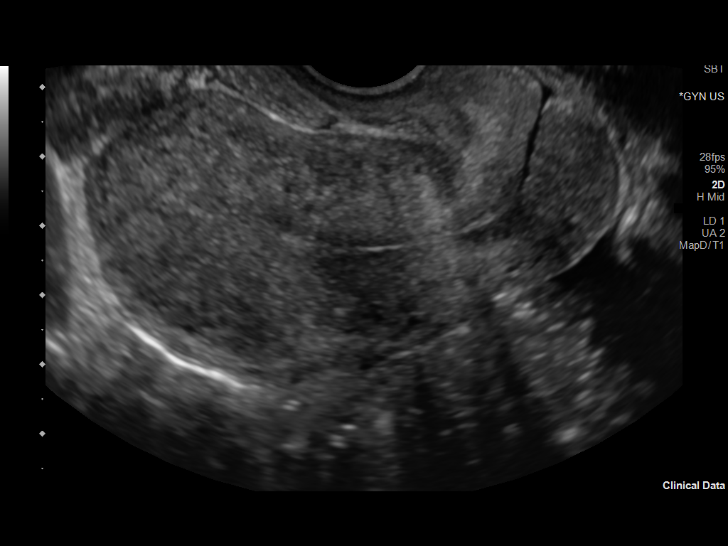
[im 6/33]
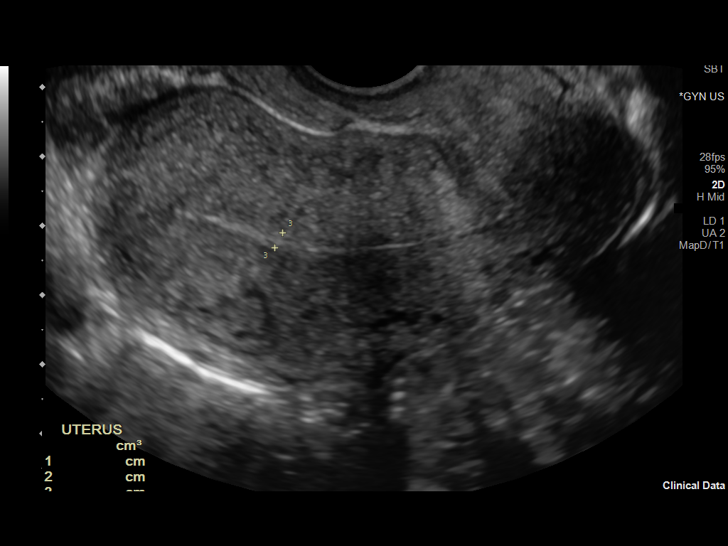
[im 9/33]
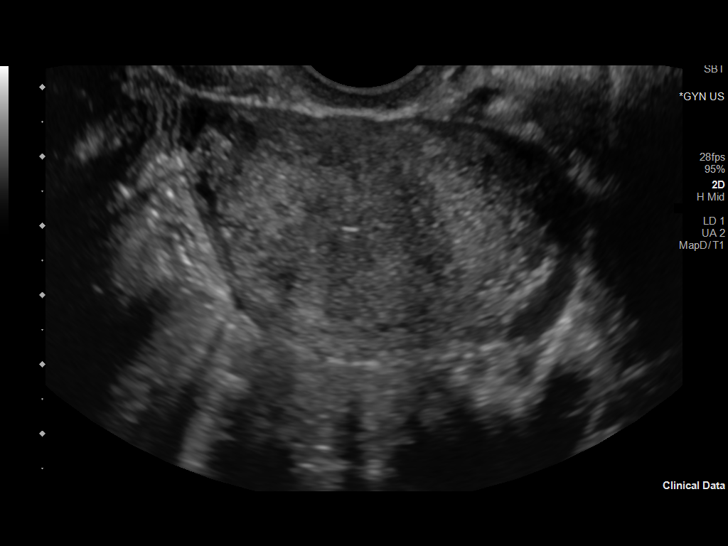
[im 11/33]
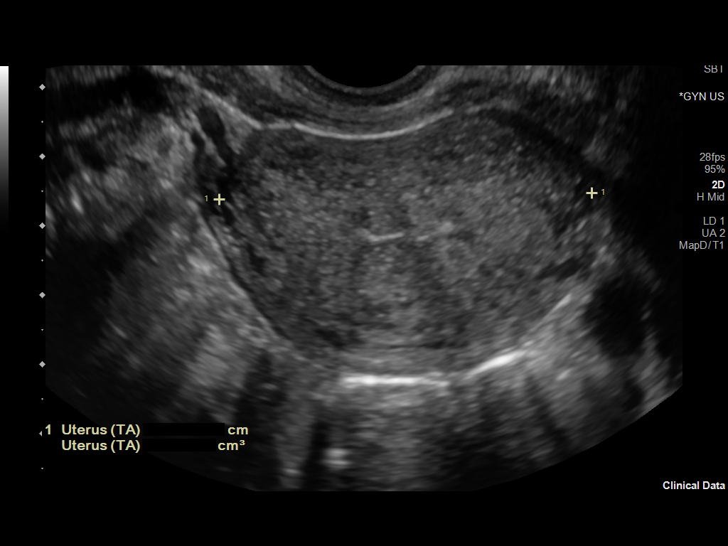
[im 13/33]
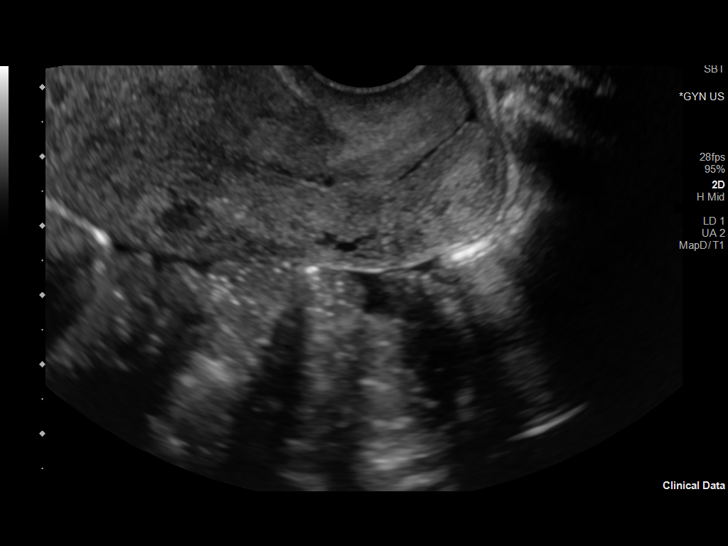
[im 15/33]
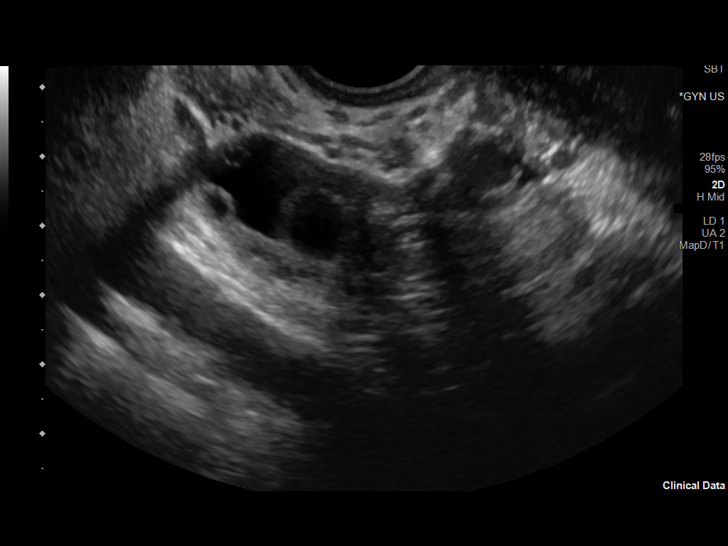
[im 18/33]
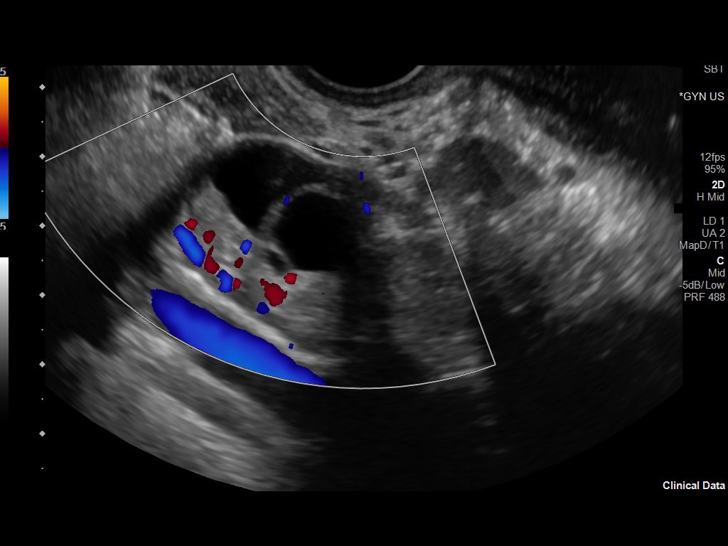
[im 21/33]
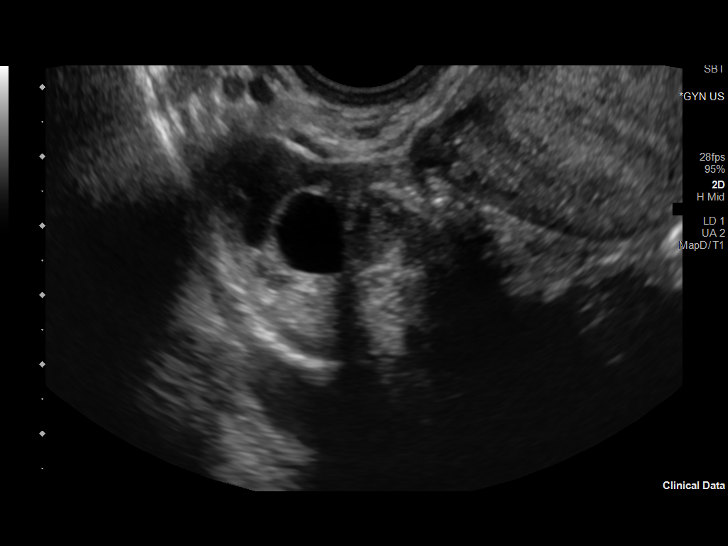
[im 22/33]
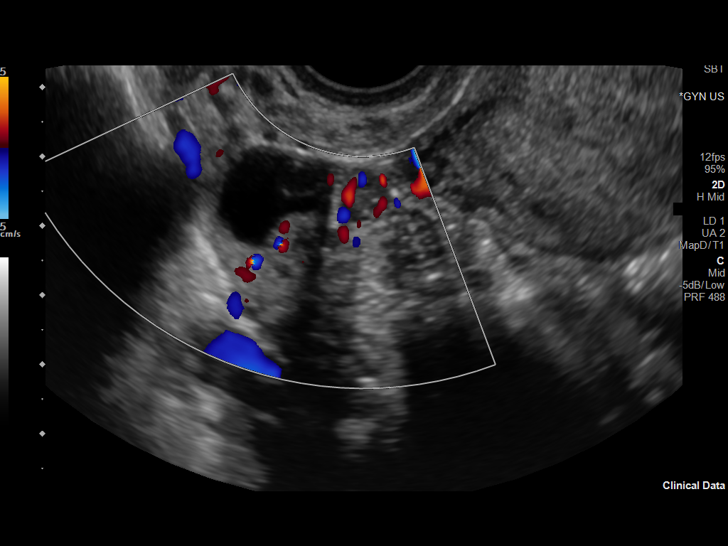
[im 25/33]
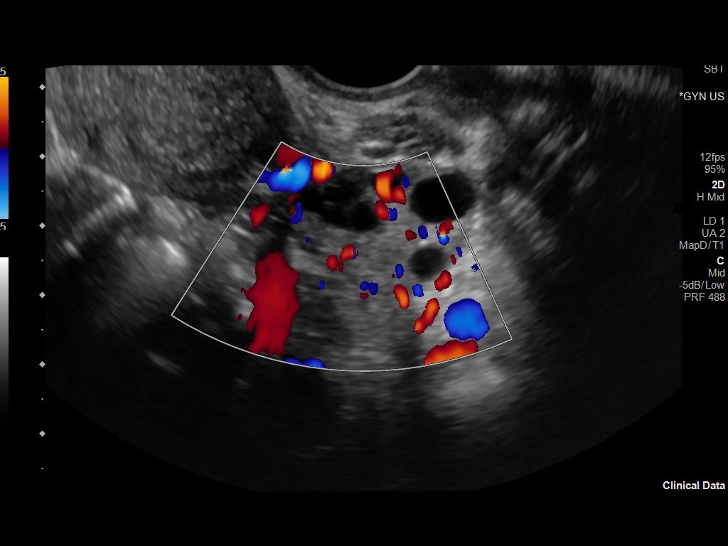
[im 27/33]
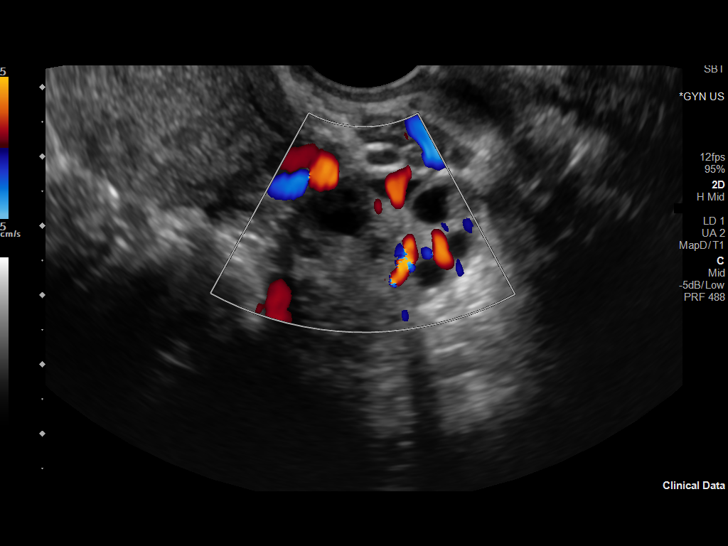
[im 30/33]
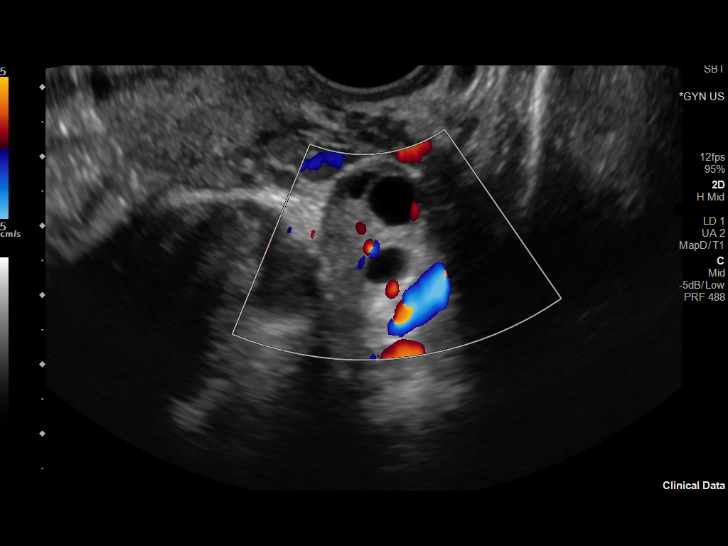
[im 33/33]
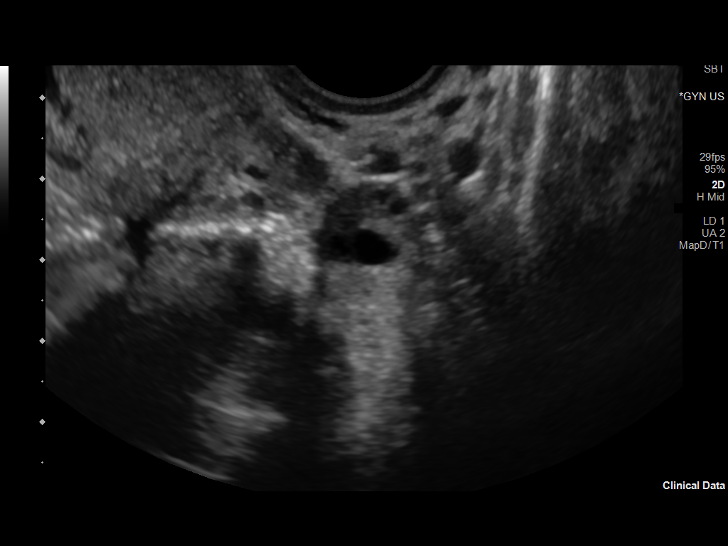

[14 of 25 positions shown; findings below may reference images not displayed]

FINDINGS: Uterus

Measurements: 7.8 x 3.9 x 5.4 cm = volume: 86 mL. No fibroids or
other mass visualized.

Endometrium

Thickness: 2 mm in thickness.  No focal abnormality visualized.

Right ovary

Measurements: 3.8 x 2.6 x 1.8 cm = volume: 9.1 mL. Normal
appearance/no adnexal mass. Multiple small follicles.

Left ovary

Measurements: 3.7 x 2.1 x 1.6 cm = volume: 6.5 mL. Normal
appearance/no adnexal mass. Multiple small follicles.

Other findings:  No abnormal free fluid
IMPRESSION: Normal pelvic ultrasound.

## 2021-03-03 ENCOUNTER — Ambulatory Visit (INDEPENDENT_AMBULATORY_CARE_PROVIDER_SITE_OTHER): Payer: BLUE CROSS/BLUE SHIELD | Admitting: Obstetrics & Gynecology

## 2021-03-03 ENCOUNTER — Other Ambulatory Visit: Payer: Self-pay

## 2021-03-03 ENCOUNTER — Encounter: Payer: Self-pay | Admitting: Obstetrics & Gynecology

## 2021-03-03 VITALS — BP 96/63 | Ht 60.0 in | Wt 85.0 lb

## 2021-03-03 DIAGNOSIS — N3001 Acute cystitis with hematuria: Secondary | ICD-10-CM | POA: Diagnosis not present

## 2021-03-03 LAB — POCT URINALYSIS DIPSTICK
Bilirubin, UA: NEGATIVE
Blood, UA: POSITIVE
Glucose, UA: NEGATIVE
Ketones, UA: POSITIVE
Nitrite, UA: POSITIVE
Protein, UA: NEGATIVE
Spec Grav, UA: <=1.005 — AB (ref 1.010–1.025)
Urobilinogen, UA: 0.2 E.U./dL
pH, UA: 7 (ref 5.0–8.0)

## 2021-03-03 MED ORDER — PHENAZOPYRIDINE HCL 200 MG PO TABS: 200.0000 mg | ORAL_TABLET | Freq: Three times a day (TID) | ORAL | 0 refills | Status: AC | PRN

## 2021-03-03 MED ORDER — SULFAMETHOXAZOLE-TRIMETHOPRIM 800-160 MG PO TABS: 1.0000 | ORAL_TABLET | Freq: Two times a day (BID) | ORAL | 1 refills | Status: AC

## 2021-03-03 NOTE — Progress Notes (Signed)
History:  20 y.o. G1P0101 here today for c/o urinary frequency, dysuria and blood in urine. Pt denies fever, chills or back pain.  Has been drinking cranberry juice.   The following portions of the patient's history were reviewed and updated as appropriate: allergies, current medications, past family history, past medical history, past social history, past surgical history and problem list.  Review of Systems:  Pertinent items are noted in HPI.    Objective:  Physical Exam Blood pressure 96/63, height 5' (1.524 m), weight 85 lb (38.6 kg), last menstrual period 02/18/2021.  CONSTITUTIONAL: Well-developed, well-nourished female in no acute distress.  HENT:  Normocephalic, atraumatic EYES: Conjunctivae and EOM are normal. No scleral icterus.  NECK: Normal range of motion SKIN: Skin is warm and dry. No rash noted. Not diaphoretic.No pallor. NEUROLGIC: Alert and oriented to person, place, and time. Normal coordination.  Abd: Soft, nondistended, + suprapubic tenderness Pelvic: no done  Labs and Imaging UA +nitrites, leuk, blood    Assessment & Plan:  Diagnoses and all orders for this visit:  Acute cystitis with hematuria -     POCT Urinalysis Dipstick -     Urine Culture -     sulfamethoxazole-trimethoprim (BACTRIM DS) 800-160 MG tablet; Take 1 tablet by mouth 2 (two) times daily. -     phenazopyridine (PYRIDIUM) 200 MG tablet; Take 1 tablet (200 mg total) by mouth 3 (three) times daily as needed for pain (urethral spasm).   F/u prn worsening sx or failure to improve in 1 week.   Masao Junker L. Harraway-Smith, M.D., [facility]

## 2021-03-03 NOTE — Progress Notes (Signed)
Patient complaining of pain in abdomen. Patient states she is having some urinary frequency and noticed blood in her urine. Armandina Stammer RN

## 2021-03-06 LAB — URINE CULTURE
